# Patient Record
Sex: Male | Born: 1985 | Race: White | Hispanic: No | Marital: Single | Smoking: Never smoker
Health system: Southern US, Community
[De-identification: ages and names within clinical notes are randomized; demographics above are authoritative.]

## PROBLEM LIST (undated history)

## (undated) HISTORY — PX: OTHER SURGICAL HISTORY: SHX169

---

## 2006-07-05 HISTORY — PX: WISDOM TOOTH EXTRACTION: SHX21

## 2019-07-06 HISTORY — PX: OTHER SURGICAL HISTORY: SHX169

## 2019-08-06 ENCOUNTER — Other Ambulatory Visit: Payer: Self-pay | Admitting: Family Medicine

## 2019-08-06 DIAGNOSIS — M5416 Radiculopathy, lumbar region: Secondary | ICD-10-CM

## 2019-08-07 ENCOUNTER — Other Ambulatory Visit: Payer: Self-pay

## 2019-08-07 ENCOUNTER — Ambulatory Visit
Admission: RE | Admit: 2019-08-07 | Discharge: 2019-08-07 | Disposition: A | Discharge status: Home / Self Care | Payer: Self-pay | Source: Ambulatory Visit | Attending: Family Medicine | Admitting: Family Medicine

## 2019-08-07 DIAGNOSIS — M5416 Radiculopathy, lumbar region: Secondary | ICD-10-CM

## 2019-08-10 ENCOUNTER — Other Ambulatory Visit: Payer: Self-pay

## 2019-08-13 ENCOUNTER — Other Ambulatory Visit: Payer: Self-pay

## 2020-10-30 ENCOUNTER — Other Ambulatory Visit: Payer: Self-pay | Admitting: Family Medicine

## 2020-10-30 DIAGNOSIS — M5416 Radiculopathy, lumbar region: Secondary | ICD-10-CM

## 2020-10-30 DIAGNOSIS — Z9889 Other specified postprocedural states: Secondary | ICD-10-CM

## 2020-11-12 ENCOUNTER — Ambulatory Visit
Admission: RE | Admit: 2020-11-12 | Discharge: 2020-11-12 | Disposition: A | Discharge status: Home / Self Care | Payer: BC Managed Care – PPO | Source: Ambulatory Visit | Attending: Family Medicine | Admitting: Family Medicine

## 2020-11-12 ENCOUNTER — Other Ambulatory Visit: Payer: Self-pay

## 2020-11-12 DIAGNOSIS — M5416 Radiculopathy, lumbar region: Secondary | ICD-10-CM

## 2020-11-12 DIAGNOSIS — Z9889 Other specified postprocedural states: Secondary | ICD-10-CM

## 2020-11-18 ENCOUNTER — Other Ambulatory Visit: Payer: No Typology Code available for payment source

## 2021-06-03 ENCOUNTER — Other Ambulatory Visit: Payer: Self-pay | Admitting: Family Medicine

## 2021-06-03 DIAGNOSIS — M5412 Radiculopathy, cervical region: Secondary | ICD-10-CM

## 2021-06-12 ENCOUNTER — Other Ambulatory Visit: Payer: Self-pay

## 2021-06-12 ENCOUNTER — Ambulatory Visit
Admission: RE | Admit: 2021-06-12 | Discharge: 2021-06-12 | Disposition: A | Discharge status: Home / Self Care | Payer: BC Managed Care – PPO | Source: Ambulatory Visit | Attending: Family Medicine | Admitting: Family Medicine

## 2021-06-12 DIAGNOSIS — M5412 Radiculopathy, cervical region: Secondary | ICD-10-CM

## 2021-07-22 ENCOUNTER — Other Ambulatory Visit: Payer: Self-pay | Admitting: Neurosurgery

## 2021-07-22 DIAGNOSIS — M79602 Pain in left arm: Secondary | ICD-10-CM

## 2021-08-12 ENCOUNTER — Ambulatory Visit
Admission: RE | Admit: 2021-08-12 | Discharge: 2021-08-12 | Disposition: A | Discharge status: Home / Self Care | Payer: BC Managed Care – PPO | Source: Ambulatory Visit | Attending: Neurosurgery | Admitting: Neurosurgery

## 2021-08-12 ENCOUNTER — Other Ambulatory Visit: Payer: Self-pay

## 2021-08-12 DIAGNOSIS — M79602 Pain in left arm: Secondary | ICD-10-CM

## 2021-09-02 ENCOUNTER — Encounter: Payer: Self-pay | Admitting: Physical Medicine and Rehabilitation

## 2021-12-14 ENCOUNTER — Encounter: Payer: Self-pay | Admitting: Physical Medicine and Rehabilitation

## 2021-12-14 ENCOUNTER — Encounter
Payer: BC Managed Care – PPO | Attending: Physical Medicine and Rehabilitation | Admitting: Physical Medicine and Rehabilitation

## 2021-12-14 VITALS — BP 134/89 | HR 105 | Ht 70.0 in | Wt 194.0 lb

## 2021-12-14 DIAGNOSIS — G8921 Chronic pain due to trauma: Secondary | ICD-10-CM | POA: Diagnosis present

## 2021-12-14 DIAGNOSIS — G8929 Other chronic pain: Secondary | ICD-10-CM | POA: Insufficient documentation

## 2021-12-14 DIAGNOSIS — M7918 Myalgia, other site: Secondary | ICD-10-CM | POA: Insufficient documentation

## 2021-12-14 DIAGNOSIS — M898X1 Other specified disorders of bone, shoulder: Secondary | ICD-10-CM | POA: Diagnosis present

## 2021-12-14 NOTE — Progress Notes (Signed)
Subjective:    Patient ID: Aaron Joyce, male    DOB: 11/18/1985, 36 y.o.   MRN: [id number]  HPI  Pt is a 36 year old R handed male with hx of  Has had back surgery- L4/5- disc herniation and R shoulder surgery- labrum and RTC year and long head of biceps repair- done in 2012.  Come in for evaluation of LUE pain,    Taking Tramadol and prednisone- Went to catch a patient's spouse and so required tramadol and prednisone just temporarily.   Started having pain in L scapula/L shoulder and L pec 6-7 years ago-  Being in chiropractor school-  Radicular type pain.  Was able to manage with manual traction, cupping, dry needling- and mostly allieve pain Over last 2 years, nothing will control pain.   More sharp burning pain. Most of time- L rhomboids and will radiate into L pec and L scapula.    Has done PT on his own-  Michelle Piper who owns his office, wife is PT and guiding him on exercises.    Does a lot of stretching- has a biaxial cervical traction device- used to help, but no relief anymore.  A lot of neck flexion exercises.  Also towel to keep cervical curve.   Is up at night due to pain.   Meds: Tried gabapentin- lower dose- PCP tried it- made him tired/groggy.  Also has had Rx for Lyrica- doesn't remember dosing- no side effects from that.  Thinks tried cymbalta- after Lyrica- 3 years ago- didn't help much.  Never tried lidocaine patch.  Tramadol right now for back pain- made too loopy to take during day, but could take at night.    PCP tried these meds- Dr Quintin Alto- all tried 3 years ago.    Is interested in Rhizotomy and Radiofrequency ablation.  EMG was basically negative per pt.  MRI showed a possible reason for R C7 radiculopathy, but nothing on L.     Pain Inventory Average Pain 3 Pain Right Now 4 My pain is constant, sharp, burning, dull, and tingling  In the last 24 hours, has pain interfered with the following? General activity 3 Relation with others  2 Enjoyment of life 2 What TIME of day is your pain at its worst? night Sleep (in general) Fair  Pain is worse with: sitting and inactivity Pain improves with: medication, TENS, and ice Relief from Meds: 2  walk without assistance ability to climb steps?  yes do you drive?  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  no CT/MRI nerve study  Any changes since last visit?  no    No family history on file. Social History   Socioeconomic History   Marital status: Single    Spouse name: Not on file   Number of children: Not on file   Years of education: Not on file   Highest education level: Not on file  Occupational History   Not on file  Tobacco Use   Smoking status: Never   Smokeless tobacco: Never  Vaping Use   Vaping Use: Never used  Substance and Sexual Activity   Alcohol use: Yes    Comment: rare   Drug use: Never   Sexual activity: Not on file  Other Topics Concern   Not on file  Social History Narrative   Not on file   Social Determinants of Health   Financial Resource Strain:  Not on file  Food Insecurity: Not on file  Transportation Needs: Not on file  Physical Activity: Not on file  Stress: Not on file  Social Connections: Not on file   Past Surgical History:  Procedure Laterality Date   low back surgery  2021   right shouder repair Right    2012   WISDOM TOOTH EXTRACTION  2008   History reviewed. No pertinent past medical history. BP 134/89   Pulse (!) 105   Ht 5\' 10"  (1.778 m)   Wt 194 lb (88 kg)   SpO2 95%   BMI 27.84 kg/m   Opioid Risk Score:   Fall Risk Score:  `1  Depression screen PHQ 2/9      No data to display          Review of Systems  Musculoskeletal:        Pain left shoulder down to left forearm  All other systems reviewed and are negative.      Objective:   Physical Exam  Awake, alert, appropriate, sitting on table;   Trigger points obvious in L rhomboids, subscapularis and L upper trap-  MS: MS: 5/5 in deltoids, biceps, Triceps, WE, grip and FA B/L  No pain with ROM of shoulder flexion/abduction, external/ internal rotation.   Neuro: No decreased to light touch or pinprick in arms B/L      Assessment & Plan:   Pt is a 36 year old R handed male with hx of  Has had back surgery- L4/5- disc herniation and R shoulder surgery- labrum and RTC year and long head of biceps repair- done in 2012.  Come in for evaluation of LUE pain.  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 2.  Patient here for trigger point injections for  Consent done and on chart.  Cleaned areas with alcohol and injected using a 27 gauge 1.5 inch needle  Injected  4cc Using 1% Lidocaine with no EPI  Upper traps Lonly Levators Posterior scalenes Middle scalenes Splenius Capitus Pectoralis Major Rhomboids- L only x2 Infraspinatus Teres Major/minor- L x2 Thoracic paraspinals Lumbar paraspinals Other injections-    Patient's level of pain prior was 5-6/10 Current level of pain after injections is 2/10  There was no bleeding or complications.  Patient was advised to drink a lot of water on day after injections to flush system Will have increased soreness for 12-48 hours after injections.  Can use Lidocaine patches the day AFTER injections Can use theracane on day of injections in places didn't inject Can use heating pad 4-6 hours AFTER injections  3. Suggest lidocaine patches- 2 patches- suggest wearing at night.    4. Let me know how the trigger point injections work- but also referral to Dr [id number] in computer.    5. F/U in 3 months- TrP injections.

## 2021-12-14 NOTE — Patient Instructions (Signed)
Pt is a 36 year old R handed male with hx of  Has had back surgery- L4/5- disc herniation and R shoulder surgery- labrum and RTC year and long head of biceps repair- done in 2012.  Come in for evaluation of LUE pain.  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 2.  Patient here for trigger point injections for  Consent done and on chart.  Cleaned areas with alcohol and injected using a 27 gauge 1.5 inch needle  Injected  4cc Using 1% Lidocaine with no EPI  Upper traps Lonly Levators Posterior scalenes Middle scalenes Splenius Capitus Pectoralis Major Rhomboids- L only x2 Infraspinatus Teres Major/minor- L x2 Thoracic paraspinals Lumbar paraspinals Other injections-    Patient's level of pain prior was 5-6/10 Current level of pain after injections is 2/10  There was no bleeding or complications.  Patient was advised to drink a lot of water on day after injections to flush system Will have increased soreness for 12-48 hours after injections.  Can use Lidocaine patches the day AFTER injections Can use theracane on day of injections in places didn't inject Can use heating pad 4-6 hours AFTER injections  3. Suggest lidocaine patches- 2 patches- suggest wearing at night.    4. Let me know how the trigger point injections work- but also referral to Dr Roderic Ovens in computer.    5. F/U in 3 months- TrP injections.

## 2022-04-12 ENCOUNTER — Ambulatory Visit: Payer: BC Managed Care – PPO | Admitting: Physical Medicine and Rehabilitation

## 2022-05-05 ENCOUNTER — Other Ambulatory Visit: Payer: Self-pay | Admitting: Orthopedic Surgery

## 2022-05-05 DIAGNOSIS — M25512 Pain in left shoulder: Secondary | ICD-10-CM

## 2022-05-15 ENCOUNTER — Ambulatory Visit
Admission: RE | Admit: 2022-05-15 | Discharge: 2022-05-15 | Disposition: A | Discharge status: Home / Self Care | Payer: BC Managed Care – PPO | Source: Ambulatory Visit | Attending: Orthopedic Surgery | Admitting: Orthopedic Surgery

## 2022-05-15 DIAGNOSIS — M25512 Pain in left shoulder: Secondary | ICD-10-CM

## 2022-08-04 ENCOUNTER — Other Ambulatory Visit: Payer: Self-pay | Admitting: Orthopedic Surgery

## 2022-08-04 DIAGNOSIS — S43432A Superior glenoid labrum lesion of left shoulder, initial encounter: Secondary | ICD-10-CM

## 2024-02-02 IMAGING — MR MR CHEST MEDIASTINUM W/O CM
6 series · 16 of 16 positions shown · non-contrast
Comparison: MRI cervical spine dated June 12, 2021.

CLINICAL DATA: Chronic left arm pain, numbness, and tingling. No
injury or prior surgery.

EXAM:
MRI BRACHIAL PLEXUS WITHOUT CONTRAST
TECHNIQUE: Multiplanar, multiecho pulse sequences of the neck and surrounding
structures were obtained without intravenous contrast. The field of
view was focused on the left brachial plexus from the neural
foramina to the axilla.

[Series 3: T2 fat-sat · axial · 3.0mm · 0.70mm/px · z∈[-131,+50]mm · 3 of 48 slices shown]
[im 1/48]
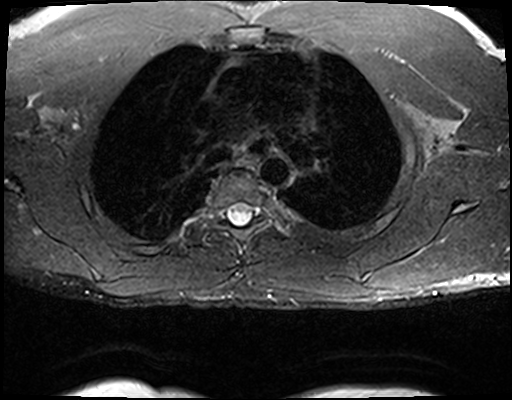
[im 24/48]
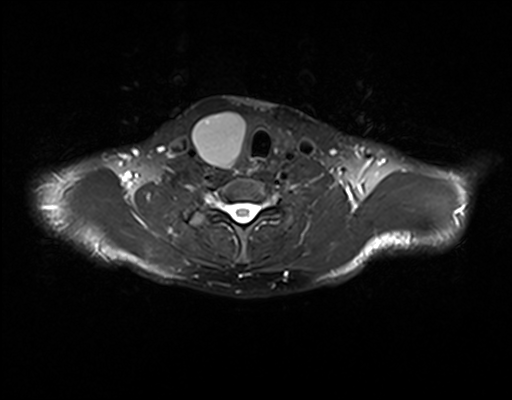
[im 48/48]
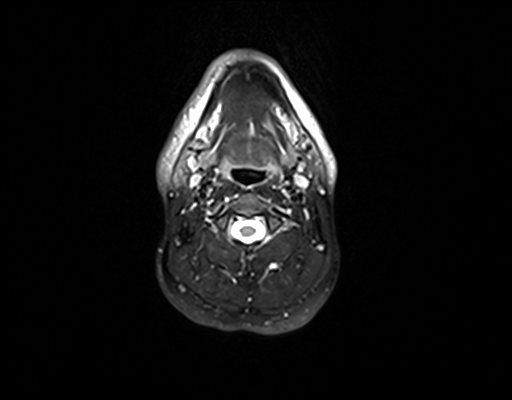

[Series 4: T1 · axial · 3.0mm · 1.41mm/px · z∈[-131,+50]mm · 3 of 48 slices shown (1 of 2)]
[im 1/48]
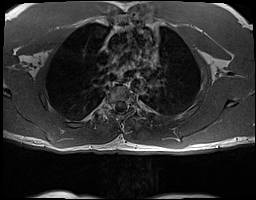
[im 24/48]
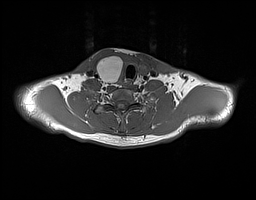
[im 48/48]
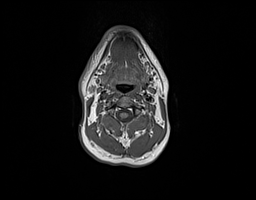

[Series 5: STIR · coronal · 3.0mm · 0.47mm/px · 2 of 29 slices shown]
[im 1/29]
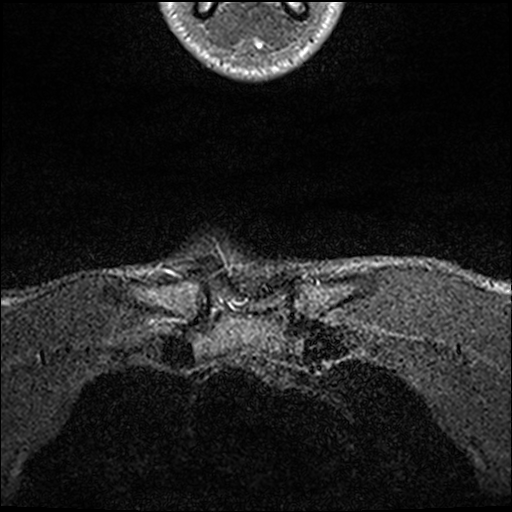
[im 29/29]
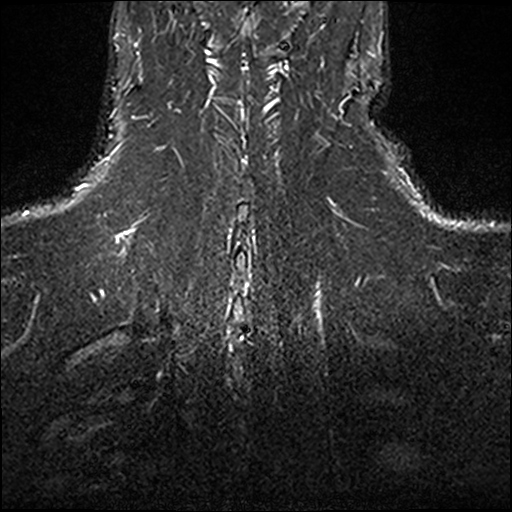

[Series 6: T1 · coronal · 3.0mm · 0.75mm/px · 2 of 29 slices shown (2 of 2)]
[im 1/29]
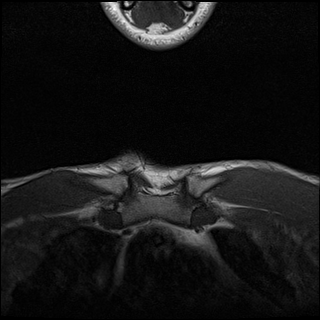
[im 29/29]
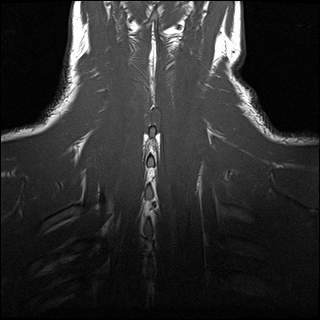

[Series 7: t1_tse sag · sagittal · 3.0mm · 0.75mm/px · 3 of 58 slices shown]
[im 1/58]
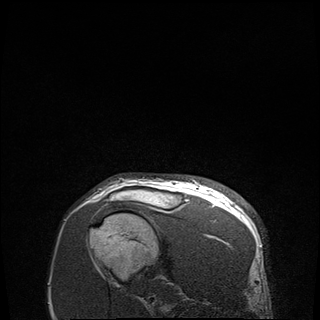
[im 29/58]
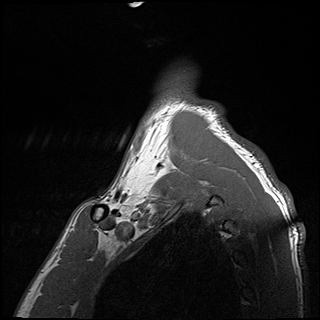
[im 58/58]
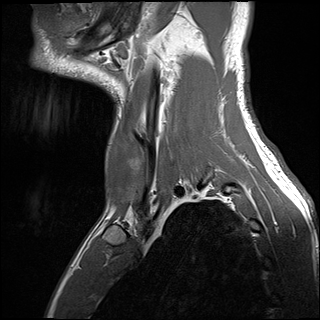

[Series 8: T2 · sagittal · 3.0mm · 0.47mm/px · 3 of 58 slices shown]
[im 1/58]
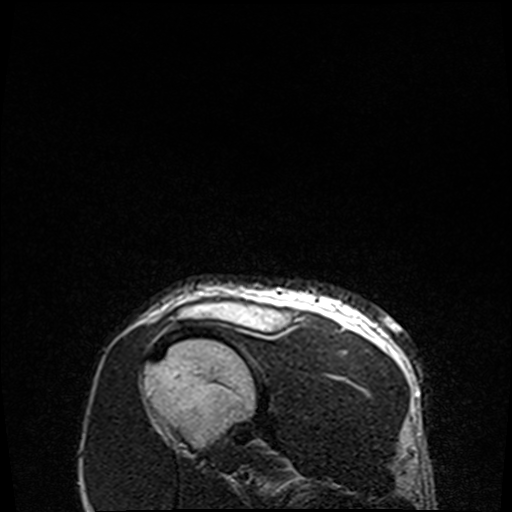
[im 29/58]
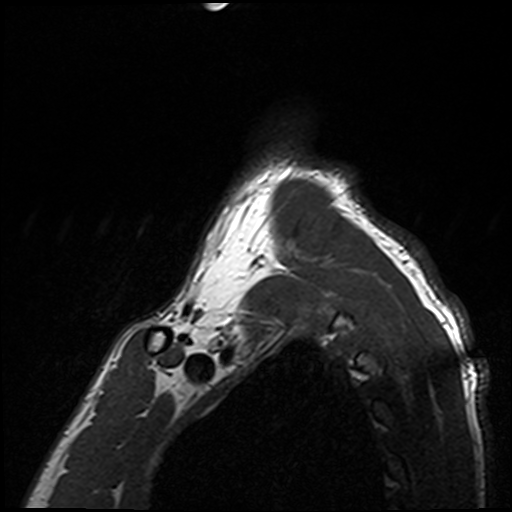
[im 58/58]
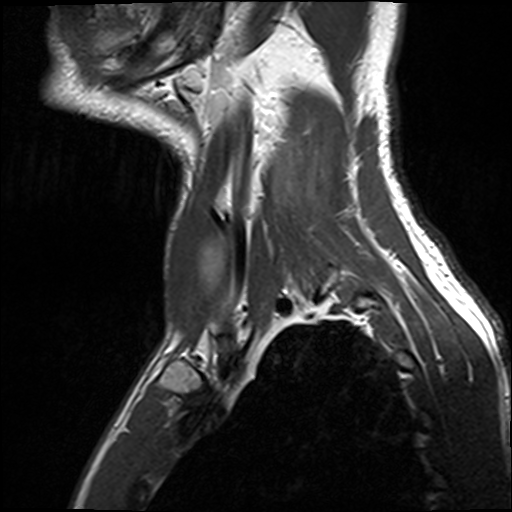

[16 of 16 positions shown; findings below may reference images not displayed]

FINDINGS: Spinal cord

Normal caliber and signal of visualized spinal cord.

Brachial plexus

Roots: Normal.

Trunks: Normal.

Divisions: Normal.

Cords: Normal.

Branches: Normal.

Muscles and tendons

Normal.

Bones

Cervical spine: Unchanged small right posterolateral disc protrusion
at C6-C7 with slight flattening of the right ventral cord.

C7 transverse processes: Normal.

Marrow signal: Normal.

Other findings

Unchanged 5.6 cm complex cystic lesion in the right thyroid lobe.
This has been evaluated on previous imaging.
IMPRESSION: 1. Normal MRI of the left brachial plexus.
